# Patient Record
Sex: Male | Born: 1980 | Race: White | Hispanic: No | Marital: Single | Smoking: Former smoker
Health system: Southern US, Community
[De-identification: ages and names within clinical notes are randomized; demographics above are authoritative.]

## PROBLEM LIST (undated history)

## (undated) DIAGNOSIS — Q786 Multiple congenital exostoses: Secondary | ICD-10-CM

---

## 2017-10-19 ENCOUNTER — Ambulatory Visit (HOSPITAL_COMMUNITY)
Admission: EM | Admit: 2017-10-19 | Discharge: 2017-10-19 | Disposition: A | Discharge status: Home / Self Care | Payer: BLUE CROSS/BLUE SHIELD | Attending: Internal Medicine | Admitting: Internal Medicine

## 2017-10-19 ENCOUNTER — Other Ambulatory Visit: Payer: Self-pay

## 2017-10-19 ENCOUNTER — Encounter (HOSPITAL_COMMUNITY): Payer: Self-pay | Admitting: *Deleted

## 2017-10-19 DIAGNOSIS — R21 Rash and other nonspecific skin eruption: Secondary | ICD-10-CM

## 2017-10-19 HISTORY — DX: Multiple congenital exostoses: Q78.6

## 2017-10-19 MED ORDER — HYDROCORTISONE 1 % EX CREA: TOPICAL_CREAM | CUTANEOUS | 0 refills | Status: DC

## 2017-10-19 MED ORDER — PERMETHRIN 5 % EX CREA: TOPICAL_CREAM | CUTANEOUS | 0 refills | Status: DC

## 2017-10-19 MED ORDER — DIPHENHYDRAMINE HCL 25 MG PO CAPS
ORAL_CAPSULE | ORAL | Status: AC
  Filled 2017-10-19: qty 2

## 2017-10-19 MED ORDER — PREDNISONE 10 MG (21) PO TBPK: ORAL_TABLET | Freq: Every day | ORAL | 0 refills | Status: DC

## 2017-10-19 MED ORDER — DIPHENHYDRAMINE HCL 25 MG PO CAPS
50.0000 mg | ORAL_CAPSULE | Freq: Once | ORAL | Status: AC
  Administered 2017-10-19: 50 mg via ORAL

## 2017-10-19 NOTE — ED Triage Notes (Signed)
C/O rash to right anterior wrist x 3 wks.  OTC treatments not working.

## 2017-10-19 NOTE — ED Provider Notes (Signed)
[facility]    CSN: [id number] Arrival date & time: 10/19/17  1603     History   Chief Complaint Chief Complaint  Patient presents with  . Rash    HPI Carl Haynes is a 37 y.o. male.   37 year old male presents with rash to right hand and wrist x3 weeks.  Patient states he is not sure if you came in contact with anything however he has been applying Gold Bond with no relief.  Patient endorses pruritus.      Past Medical History:  Diagnosis Date  . Multiple exostoses syndrome     There are no active problems to display for this patient.   History reviewed. No pertinent surgical history.     Home Medications    Prior to Admission medications   Not on File    Family History History reviewed. No pertinent family history.  Social History Social History   Tobacco Use  . Smoking status: Never Smoker  . Smokeless tobacco: Never Used  Substance Use Topics  . Alcohol use: Yes    Comment: occasionally  . Drug use: Never     Allergies   Penicillins   Review of Systems Review of Systems  Constitutional: Negative for chills and fever.  HENT: Negative for ear pain and sore throat.   Eyes: Negative for pain and visual disturbance.  Respiratory: Negative for cough and shortness of breath.   Cardiovascular: Negative for chest pain and palpitations.  Gastrointestinal: Negative for abdominal pain and vomiting.  Genitourinary: Negative for dysuria and hematuria.  Musculoskeletal: Negative for arthralgias and back pain.  Skin: Positive for rash ( right hand). Negative for color change.  Neurological: Negative for seizures and syncope.  All other systems reviewed and are negative.    Physical Exam Triage Vital Signs ED Triage Vitals  Enc Vitals Group     BP 10/19/17 1755 136/78     Pulse Rate 10/19/17 1755 72     Resp 10/19/17 1755 14     Temp 10/19/17 1755 98.3 F (36.8 C)     Temp Source 10/19/17 1755 Oral     SpO2 10/19/17 1755 100 %     Weight --      Height --      Head Circumference --      Peak Flow --      Pain Score 10/19/17 1756 0     Pain Loc --      Pain Edu? --      Excl. in GC? --    No data found.  Updated Vital Signs BP 136/78   Pulse 72   Temp 98.3 F (36.8 C) (Oral)   Resp 14   SpO2 100%   Visual Acuity Right Eye Distance:   Left Eye Distance:   Bilateral Distance:    Right Eye Near:   Left Eye Near:    Bilateral Near:     Physical Exam  Constitutional: He is oriented to person, place, and time. He appears well-developed and well-nourished.  HENT:  Head: Normocephalic.  Neck: Normal range of motion.  Pulmonary/Chest: Effort normal.  Musculoskeletal: Normal range of motion.  Neurological: He is alert and oriented to person, place, and time.  Skin: Skin is dry. Rash ( right hand and anterior aspect of right wrist. ) noted.  Psychiatric: He has a normal mood and affect.  Nursing note and vitals reviewed.    UC Treatments / Results  Labs (all labs ordered are listed, but only  abnormal results are displayed) Labs Reviewed - No data to display  EKG None  Radiology No results found.  Procedures Procedures (including critical care time)  Medications Ordered in UC Medications  diphenhydrAMINE (BENADRYL) capsule 50 mg (has no administration in time range)    Initial Impression / Assessment and Plan / UC Course  I have reviewed the triage vital signs and the nursing notes.  Pertinent labs & imaging results that were available during my care of the patient were reviewed by me and considered in my medical decision making (see chart for details).      Final Clinical Impressions(s) / UC Diagnoses   Final diagnoses:  None   Discharge Instructions   None    ED Prescriptions    None     Controlled Substance Prescriptions [place] Controlled Substance Registry consulted? Not Applicable   Alene MiresOmohundro, Vanilla Heatherington C, NP 10/19/17 1819

## 2018-01-05 ENCOUNTER — Emergency Department (HOSPITAL_COMMUNITY)
Admission: EM | Admit: 2018-01-05 | Discharge: 2018-01-05 | Disposition: A | Discharge status: Home / Self Care | Payer: BLUE CROSS/BLUE SHIELD | Attending: Emergency Medicine | Admitting: Emergency Medicine

## 2018-01-05 ENCOUNTER — Encounter (HOSPITAL_COMMUNITY): Payer: Self-pay | Admitting: Emergency Medicine

## 2018-01-05 ENCOUNTER — Other Ambulatory Visit: Payer: Self-pay

## 2018-01-05 DIAGNOSIS — R21 Rash and other nonspecific skin eruption: Secondary | ICD-10-CM | POA: Insufficient documentation

## 2018-01-05 MED ORDER — TRIAMCINOLONE ACETONIDE 0.5 % EX OINT: 1.0000 "application " | TOPICAL_OINTMENT | Freq: Two times a day (BID) | CUTANEOUS | 0 refills | Status: AC

## 2018-01-05 NOTE — ED Triage Notes (Addendum)
Was seen  At [facility] for rash did not finish the steriods , states rash is worse did not understand instructions rash is on arms

## 2018-01-05 NOTE — Discharge Instructions (Addendum)
Please see the information and instructions below regarding your visit.  Your diagnoses today include:  1. Rash and nonspecific skin eruption    Your rash appears consistent with psoriasis.  This is an inflammatory and autoimmune lesion.  Tests performed today include: See side panel of your discharge paperwork for testing performed today. Vital signs are listed at the bottom of these instructions.   Medications prescribed:    Take any prescribed medications only as prescribed, and any over the counter medications only as directed on the packaging.  Please apply the Kenalog cream twice a day for 7 to 10 days.  Please do not apply this anywhere else on the body, as it is a strong topical medication.  Home care instructions:  Please follow any educational materials contained in this packet.   Follow-up instructions: Please follow-up with your primary care provider as soon as possible for further evaluation of your symptoms if they are not completely improved.   Please follow up with dermatology as soon as possible.   Return instructions:  Please return to the Emergency Department if you experience worsening symptoms. Return to the emergency department for any increasing redness of the arms, or fever or chills with your symptoms. Please return if you have any other emergent concerns.  Additional Information:   Your vital signs today were: BP 129/84 (BP Location: Right Arm)    Pulse 93    Temp 98.6 F (37 C) (Oral)    Resp 17    SpO2 100%  If your blood pressure (BP) was elevated on multiple readings during this visit above 130 for the top number or above 80 for the bottom number, please have this repeated by your primary care provider within one month. --------------  Thank you for allowing [place] to participate in your care today.

## 2018-01-05 NOTE — ED Provider Notes (Signed)
[facility] EMERGENCY DEPARTMENT Provider Note   CSN: [id number] Arrival date & time: 01/05/18  1223     History   Chief Complaint Chief Complaint  Patient presents with  . Rash    HPI Carl Haynes is a 37 y.o. male.  HPI  Patient is a 37 year old male with a history of multiple exostoses syndrome presenting for rash on his right upper extremity for approximately 4 to 5 months.  He reports that he was treated at urgent care in August 2019, however the rash has returned and is not improved.  He describes it is scaly and flaky, and pruritic.  Patient does report he has occasional joint pain.  Patient denies any fevers, chills, recent travel, chemical exposure, changes in topical treatments, lotions, soaps, detergents, or changes in medications. Denies history of IVDU.   Past Medical History:  Diagnosis Date  . Multiple exostoses syndrome     There are no active problems to display for this patient.   History reviewed. No pertinent surgical history.      Home Medications    Prior to Admission medications   Medication Sig Start Date End Date Taking? Authorizing Provider  hydrocortisone cream 1 % Apply to affected area 2 times daily 10/19/17   Alene Mires, NP  permethrin (ELIMITE) 5 % cream Apply to affected area once 10/19/17   Alene Mires, NP  predniSONE (STERAPRED UNI-PAK 21 TAB) 10 MG (21) TBPK tablet Take by mouth daily. Take 6 tabs by mouth daily  for 2 days, then 5 tabs for 2 days, then 4 tabs for 2 days, then 3 tabs for 2 days, 2 tabs for 2 days, then 1 tab by mouth daily for 2 days 10/19/17   Alene Mires, NP    Family History No family history on file.  Social History Social History   Tobacco Use  . Smoking status: Never Smoker  . Smokeless tobacco: Never Used  Substance Use Topics  . Alcohol use: Yes    Comment: occasionally  . Drug use: Never     Allergies   Penicillins   Review of Systems Review of  Systems  Constitutional: Negative for chills and fever.  Skin: Positive for color change and rash.  Allergic/Immunologic: Negative for immunocompromised state.     Physical Exam Updated Vital Signs BP 129/84 (BP Location: Right Arm)   Pulse 93   Temp 98.6 F (37 C) (Oral)   Resp 17   SpO2 100%   Physical Exam  Constitutional: He appears well-developed and well-nourished. No distress.  Sitting comfortably in exam chair.   HENT:  Head: Normocephalic and atraumatic.  Eyes: Conjunctivae are normal. Right eye exhibits no discharge. Left eye exhibits no discharge.  EOMs normal to gross examination.  Neck: Normal range of motion.  Cardiovascular: Normal rate and regular rhythm.  Intact, 2+ radial pulse of RUE.   Pulmonary/Chest:  Normal respiratory effort. Patient converses comfortably. No audible wheeze or stridor.  Abdominal: He exhibits no distension.  Musculoskeletal: Normal range of motion.  Neurological: He is alert.  Cranial nerves intact to gross observation. Patient moves extremities without difficulty.  Skin: Skin is warm and dry. He is not diaphoretic. There is erythema.  Please see clinical photo for details.  Patient has erythematous plaque with overlying scales of the inter-webspace between the first and second digits the right upper extreme and extending up to the wrist.  Patient has a linear erythematous plaque that is extending from the volar  surface of the wrist up to the antecubital region.  Psychiatric: He has a normal mood and affect. His behavior is normal. Judgment and thought content normal.  Nursing note and vitals reviewed.        ED Treatments / Results  Labs (all labs ordered are listed, but only abnormal results are displayed) Labs Reviewed - No data to display  EKG None  Radiology No results found.  Procedures Procedures (including critical care time)  Medications Ordered in ED Medications - No data to display   Initial Impression /  Assessment and Plan / ED Course  I have reviewed the triage vital signs and the nursing notes.  Pertinent labs & imaging results that were available during my care of the patient were reviewed by me and considered in my medical decision making (see chart for details).     Patient nontoxic-appearing, afebrile, and in no acute distress.  Patient with lesions of the right upper extremity that are consistent with psoriatic lesions.  Patient denies any history of IVDU.  No history of immune compromise status.  We will treat with Kenalog cream.  Patient instructed that he needs to follow-up with a dermatologist and primary care provider soon as possible.  I instructed patient on how to do this.  Return precautions given for any increasing erythema, fever or chills with rash or any new or worsening symptoms.  Patient is in understanding and agrees with the plan of care.  Final Clinical Impressions(s) / ED Diagnoses   Final diagnoses:  Rash and nonspecific skin eruption    ED Discharge Orders         Ordered    triamcinolone ointment (KENALOG) 0.5 %  2 times daily     01/05/18 1341           Elisha Ponder, [place] 01/05/18 1342    Tegeler, Canary Brim, MD 01/05/18 1956

## 2019-02-01 ENCOUNTER — Emergency Department (HOSPITAL_COMMUNITY)
Admission: EM | Admit: 2019-02-01 | Discharge: 2019-02-01 | Disposition: A | Discharge status: Home / Self Care | Payer: BC Managed Care – PPO | Attending: Emergency Medicine | Admitting: Emergency Medicine

## 2019-02-01 ENCOUNTER — Emergency Department (HOSPITAL_COMMUNITY): Payer: BC Managed Care – PPO

## 2019-02-01 ENCOUNTER — Encounter (HOSPITAL_COMMUNITY): Payer: Self-pay

## 2019-02-01 ENCOUNTER — Other Ambulatory Visit: Payer: Self-pay

## 2019-02-01 DIAGNOSIS — M545 Low back pain, unspecified: Secondary | ICD-10-CM

## 2019-02-01 DIAGNOSIS — Y999 Unspecified external cause status: Secondary | ICD-10-CM | POA: Diagnosis not present

## 2019-02-01 DIAGNOSIS — F419 Anxiety disorder, unspecified: Secondary | ICD-10-CM

## 2019-02-01 DIAGNOSIS — Y939 Activity, unspecified: Secondary | ICD-10-CM | POA: Insufficient documentation

## 2019-02-01 DIAGNOSIS — T148XXA Other injury of unspecified body region, initial encounter: Secondary | ICD-10-CM

## 2019-02-01 DIAGNOSIS — R52 Pain, unspecified: Secondary | ICD-10-CM

## 2019-02-01 DIAGNOSIS — L409 Psoriasis, unspecified: Secondary | ICD-10-CM

## 2019-02-01 DIAGNOSIS — Y929 Unspecified place or not applicable: Secondary | ICD-10-CM | POA: Insufficient documentation

## 2019-02-01 DIAGNOSIS — Z87891 Personal history of nicotine dependence: Secondary | ICD-10-CM | POA: Diagnosis not present

## 2019-02-01 HISTORY — DX: Psoriasis, unspecified: L40.9

## 2019-02-01 HISTORY — DX: Anxiety disorder, unspecified: F41.9

## 2019-02-01 MED ORDER — LIDOCAINE 5 % EX PTCH
1.0000 | MEDICATED_PATCH | Freq: Once | CUTANEOUS | Status: DC
  Administered 2019-02-01: 10:00:00 1 via TRANSDERMAL
  Filled 2019-02-01: qty 1

## 2019-02-01 MED ORDER — CYCLOBENZAPRINE HCL 10 MG PO TABS: 10.0000 mg | ORAL_TABLET | Freq: Two times a day (BID) | ORAL | 0 refills | Status: DC | PRN

## 2019-02-01 MED ORDER — CYCLOBENZAPRINE HCL 10 MG PO TABS
10.0000 mg | ORAL_TABLET | Freq: Once | ORAL | Status: AC
  Administered 2019-02-01: 10 mg via ORAL
  Filled 2019-02-01: qty 1

## 2019-02-01 NOTE — Discharge Instructions (Addendum)
Your back pain should be treated with medicines such as ibuprofen or tylenol and this back pain should get better over the next 2 weeks.  ° °Follow Up: Please follow up with your primary healthcare provider in 1-2 weeks for reassessment. if you do not have a primary care doctor use the resource guide provided to find one. ° °Low back pain is discomfort in the lower back that may be due to injuries to muscles and ligaments around the spine. Occasionally, it may be caused by a a problem to a part of the spine called a disc. The pain may last several days or a week;  However, most patients get completely well in 4 weeks. ° ° °1. Medications: Alternate 600 mg of ibuprofen and 500-1000 mg of Tylenol every 3 hours as needed for pain. Do not exceed 4000 mg of Tylenol daily.  Take ibuprofen with food to avoid upset stomach issues.   °Muscle relaxants:  These medications can help with muscle tightness that is a cause of lower back pain. Most of these medications can cause drowsiness, and it is not safe to drive or use dangerous machinery while taking them.You can take Flexeril as needed for muscle spasm up to twice daily but do not drive, drink alcohol, or operate heavy machinery while taking this medicine because it may make you drowsy.  I typically recommend taking this medicine only at night when you are going to sleep.  You can also cut these tablets in half if they make you feel very drowsy. ° °2. Treatment: rest, drink plenty of fluids, gentle stretching as discussed (see attached), alternate ice and heat (or stick with whichever feels best) 20 minutes on 20 minutes off. Maintaining your daily activities, including walking, is encourged, as it will help you get better faster than just staying in bed. ° ° ° °Be aware that if you develop new symptoms, such as a fever, leg weakness, difficulty with or loss of control of your urine or bowels, abdominal pain, or more severe pain, you will need to seek medical attention  immediately and  / or return to the Emergency department. ° ° ° ° °
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
## 2019-06-11 ENCOUNTER — Ambulatory Visit: Payer: BC Managed Care – PPO | Attending: Internal Medicine

## 2019-06-11 DIAGNOSIS — Z23 Encounter for immunization: Secondary | ICD-10-CM

## 2019-06-11 NOTE — Progress Notes (Signed)
   Covid-19 Vaccination Clinic  Name:  Carl Haynes    MRN: [id number] DOB: 19-Feb-1981  06/11/2019  [name] was observed post Covid-19 immunization for 15 minutes without incident. He was provided with Vaccine Information Sheet and instruction to access the V-Safe system.   [name] was instructed to call 911 with any severe reactions post vaccine: Marland Kitchen Difficulty breathing  . Swelling of face and throat  . A fast heartbeat  . A bad rash all over body  . Dizziness and weakness   Immunizations Administered    Name Date Dose VIS Date Route   Pfizer COVID-19 Vaccine 06/11/2019  9:48 AM 0.3 mL 02/06/2019 Intramuscular   Manufacturer: [employer], [employer]   Lot: [id number]   NDC: [address]-3

## 2019-07-06 ENCOUNTER — Ambulatory Visit: Payer: BC Managed Care – PPO | Attending: Internal Medicine

## 2019-07-06 DIAGNOSIS — Z23 Encounter for immunization: Secondary | ICD-10-CM

## 2019-07-06 NOTE — Progress Notes (Signed)
   Covid-19 Vaccination Clinic  Name:  Masen Luallen    MRN: [id number] DOB: 11-Dec-1980  07/06/2019  Mr. Colberg was observed post Covid-19 immunization for 15 minutes without incident. He was provided with Vaccine Information Sheet and instruction to access the V-Safe system.   Mr. Vanzanten was instructed to call 911 with any severe reactions post vaccine: Marland Kitchen Difficulty breathing  . Swelling of face and throat  . A fast heartbeat  . A bad rash all over body  . Dizziness and weakness   Immunizations Administered    Name Date Dose VIS Date Route   Pfizer COVID-19 Vaccine 07/06/2019 11:57 AM 0.3 mL 04/22/2018 Intramuscular   Manufacturer: [employer], [employer]   Lot: [id number]   NDC: [address]-7
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
## 2022-04-29 ENCOUNTER — Encounter (HOSPITAL_COMMUNITY): Payer: Self-pay

## 2022-04-29 ENCOUNTER — Emergency Department (HOSPITAL_COMMUNITY)
Admission: EM | Admit: 2022-04-29 | Discharge: 2022-04-29 | Disposition: A | Discharge status: Home / Self Care | Payer: No Typology Code available for payment source | Attending: Emergency Medicine | Admitting: Emergency Medicine

## 2022-04-29 ENCOUNTER — Ambulatory Visit (HOSPITAL_COMMUNITY)
Admission: EM | Admit: 2022-04-29 | Discharge: 2022-04-29 | Disposition: A | Discharge status: Home / Self Care | Payer: No Typology Code available for payment source

## 2022-04-29 ENCOUNTER — Emergency Department (HOSPITAL_COMMUNITY): Payer: No Typology Code available for payment source

## 2022-04-29 ENCOUNTER — Other Ambulatory Visit: Payer: Self-pay

## 2022-04-29 DIAGNOSIS — M25512 Pain in left shoulder: Secondary | ICD-10-CM | POA: Insufficient documentation

## 2022-04-29 NOTE — ED Notes (Signed)
Sling placed back on left shoulder without difficulty.

## 2022-04-29 NOTE — ED Provider Notes (Signed)
Carl Haynes Note   CSN: [id number] Arrival date & time: 04/29/22  1803     History  Chief Complaint  Patient presents with   Shoulder Injury    Carl Haynes is a 42 y.o. male with history of recurring left shoulder dislocation presented ED with complaint of shoulder dislocation.  Patient ports he was at work he was reaching overhead and felt the shoulder dislocate.  He says this happens multiple times in a year and he typically is able to relocate it but was not able to do so today.  He went to an urgent care was referred into the ED for further evaluation.  HPI     Home Medications Prior to Admission medications   Medication Sig Start Date End Date Taking? Authorizing Haynes  citalopram (CELEXA) 20 MG tablet Take 20 mg by mouth daily.    Haynes, Historical, MD  cyclobenzaprine (FLEXERIL) 10 MG tablet Take 1 tablet (10 mg total) by mouth 2 (two) times daily as needed for muscle spasms. 02/01/19   Barrie Folk, PA-C  hydrocortisone cream 1 % Apply to affected area 2 times daily Patient not taking: Reported on 02/01/2019 10/19/17   Jacqualine Mau, NP  permethrin (ELIMITE) 5 % cream Apply to affected area once Patient not taking: Reported on 02/01/2019 10/19/17   Jacqualine Mau, NP  QUEtiapine (SEROQUEL) 200 MG tablet Take 200 mg by mouth at bedtime. 04/11/22   Haynes, Historical, MD      Allergies    Penicillins    Review of Systems   Review of Systems  Physical Exam Updated Vital Signs BP (!) 130/95   Pulse 92   Temp 98.5 F (36.9 C)   Resp 17   Ht '5\' 5"'$  (1.651 m)   Wt 65.8 kg   SpO2 98%   BMI 24.13 kg/m  Physical Exam Constitutional:      General: He is not in acute distress. HENT:     Head: Normocephalic and atraumatic.  Eyes:     Conjunctiva/sclera: Conjunctivae normal.     Pupils: Pupils are equal, round, and reactive to light.  Cardiovascular:     Rate and Rhythm: Normal rate  and regular rhythm.     Pulses: Normal pulses.  Pulmonary:     Effort: Pulmonary effort is normal. No respiratory distress.  Musculoskeletal:     Comments: Squaring of the left shoulder, no crepitus or bony tenderness,  Skin:    General: Skin is warm and dry.  Neurological:     General: No focal deficit present.     Mental Status: He is alert and oriented to person, place, and time. Mental status is at baseline.     Sensory: No sensory deficit.  Psychiatric:        Mood and Affect: Mood normal.        Behavior: Behavior normal.     ED Results / Procedures / Treatments   Labs (all labs ordered are listed, but only abnormal results are displayed) Labs Reviewed - No data to display  EKG None  Radiology DG Shoulder Left  Result Date: 04/29/2022 CLINICAL DATA:  Heavy lifting with left shoulder pain and possible dislocation, initial encounter EXAM: LEFT SHOULDER - 2+ VIEW COMPARISON:  None Available. FINDINGS: Deformity of the proximal humerus is noted likely related to prior fracture and healing. The humeral head is well seated. No acute fracture is noted. The underlying bony thorax appears within normal limits. IMPRESSION:  Deformity of the proximal humeral shaft likely related to prior trauma and healing. This is stable from 2020. No acute fracture or dislocation is noted. Electronically Signed   By: Inez Catalina M.D.   On: 04/29/2022 19:00    Procedures Procedures    Medications Ordered in ED Medications - No data to display  ED Course/ Medical Decision Making/ A&P                             Medical Decision Making Amount and/or Complexity of Data Reviewed Radiology: ordered.   Patient is here with left shoulder dislocation.  I personally reviewed and interpreted the patient's x-rays which was notable for no evident dislocation  The patient was able to perform full range of motion at the left shoulder and was able to pull his shirt off of his head with overhead arm  raise.  However seem to have some imitation when I asked him to actively raise his left arm up to 90 degrees.  I question whether he may have a rotator cuff injury instead.  We will place him in an arm sling to help immobilize the shoulder and I strongly encouraged him to follow-up with an orthopedic doctor for these recurring dislocations.  He verbalized understanding.  The patient came with a sling and can leave at the same sling         Final Clinical Impression(s) / ED Diagnoses Final diagnoses:  Acute pain of left shoulder    Rx / DC Orders ED Discharge Orders     None         Akaya Proffit, Carola Rhine, MD 04/29/22 1943

## 2022-04-29 NOTE — ED Triage Notes (Addendum)
Pt reports he was at work and had to lift something up high and when his left shoulder became dislocated. States this has happened multiple times in the past. States he is usually able to get it to go back in place, but did not try to today. Went to UC and they sent him to the ED.

## 2022-04-29 NOTE — Discharge Instructions (Addendum)
Please go to the ER immediately for further evaluation and management of your left shoulder dislocation as we do not have the tools to be able to reduce this in the urgent care setting.  I have provided you with a sling.

## 2022-04-29 NOTE — ED Triage Notes (Signed)
Pt reports he was lifting something that was high up and pulled his left shoulder out of socket today. He has reoccurring shoulder socket problems.

## 2022-04-29 NOTE — ED Provider Notes (Signed)
[facility]    CSN: [id number] Arrival date & time: 04/29/22  1624      History   Chief Complaint Chief Complaint  Patient presents with   Shoulder Injury    HPI Carl Haynes is a 42 y.o. male.   Patient presents to urgent care for evaluation of shoulder dislocation after he was attempting to reach for something on a high shelf today     Shoulder Injury    Past Medical History:  Diagnosis Date   Anxiety 02/01/2019   Multiple exostoses syndrome    Psoriasis 02/01/2019    There are no problems to display for this patient.   History reviewed. No pertinent surgical history.     Home Medications    Prior to Admission medications   Medication Sig Start Date End Date Taking? Authorizing Provider  QUEtiapine (SEROQUEL) 200 MG tablet Take 200 mg by mouth at bedtime. 04/11/22  Yes [provider]  citalopram (CELEXA) 20 MG tablet Take 20 mg by mouth daily.    [provider]  cyclobenzaprine (FLEXERIL) 10 MG tablet Take 1 tablet (10 mg total) by mouth 2 (two) times daily as needed for muscle spasms. 02/01/19   Barrie Folk, PA-C  hydrocortisone cream 1 % Apply to affected area 2 times daily Patient not taking: Reported on 02/01/2019 10/19/17   Jacqualine Mau, NP  permethrin (ELIMITE) 5 % cream Apply to affected area once Patient not taking: Reported on 02/01/2019 10/19/17   Jacqualine Mau, NP    Family History History reviewed. No pertinent family history.  Social History Social History   Tobacco Use   Smoking status: Former    Types: Cigarettes   Smokeless tobacco: Never  Vaping Use   Vaping Use: Never used  Substance Use Topics   Alcohol use: Yes    Comment: occasionally   Drug use: Not Currently    Comment: Used to smoke marijuana but stopped once prescribed anxiety medicaiton     Allergies   Penicillins   Review of Systems Review of Systems   Physical Exam Triage Vital Signs ED Triage Vitals   Enc Vitals Group     BP 04/29/22 1708 125/72     Pulse Rate 04/29/22 1708 95     Resp 04/29/22 1708 20     Temp 04/29/22 1708 98.6 F (37 C)     Temp Source 04/29/22 1708 Oral     SpO2 04/29/22 1708 96 %     Weight --      Height --      Head Circumference --      Peak Flow --      Pain Score 04/29/22 1710 6     Pain Loc --      Pain Edu? --      Excl. in [place]? --    No data found.  Updated Vital Signs BP 125/72 (BP Location: Left Arm)   Pulse 95   Temp 98.6 F (37 C) (Oral)   Resp 20   SpO2 96%   Visual Acuity Right Eye Distance:   Left Eye Distance:   Bilateral Distance:    Right Eye Near:   Left Eye Near:    Bilateral Near:     Physical Exam   UC Treatments / Results  Labs (all labs ordered are listed, but only abnormal results are displayed) Labs Reviewed - No data to display  EKG   Radiology No results found.  Procedures Procedures (including critical care  time)  Medications Ordered in UC Medications - No data to display  Initial Impression / Assessment and Plan / UC Course  I have reviewed the triage vital signs and the nursing notes.  Pertinent labs & imaging results that were available during my care of the patient were reviewed by me and considered in my medical decision making (see chart for details).     *** Final Clinical Impressions(s) / UC Diagnoses   Final diagnoses:  None   Discharge Instructions   None    ED Prescriptions   None    PDMP not reviewed this encounter.

## 2022-08-22 ENCOUNTER — Emergency Department (HOSPITAL_COMMUNITY)
Admission: EM | Admit: 2022-08-22 | Discharge: 2022-08-22 | Discharge status: Against Medical Advice | Payer: No Typology Code available for payment source | Attending: Emergency Medicine | Admitting: Emergency Medicine

## 2022-08-22 ENCOUNTER — Other Ambulatory Visit: Payer: Self-pay

## 2022-08-22 DIAGNOSIS — Z5321 Procedure and treatment not carried out due to patient leaving prior to being seen by health care provider: Secondary | ICD-10-CM | POA: Diagnosis not present

## 2022-08-22 DIAGNOSIS — R220 Localized swelling, mass and lump, head: Secondary | ICD-10-CM | POA: Diagnosis present

## 2022-08-22 DIAGNOSIS — K0381 Cracked tooth: Secondary | ICD-10-CM | POA: Insufficient documentation

## 2022-08-22 NOTE — ED Notes (Signed)
Patient stated he was leaving. He had to go to work.

## 2022-08-22 NOTE — ED Triage Notes (Signed)
Pt reports 2 days of facial swelling on the right.  Has a broken tooth on that side.

## 2022-10-20 ENCOUNTER — Other Ambulatory Visit: Payer: Self-pay

## 2022-10-20 ENCOUNTER — Emergency Department (HOSPITAL_COMMUNITY)
Admission: EM | Admit: 2022-10-20 | Discharge: 2022-10-20 | Disposition: A | Discharge status: Home / Self Care | Payer: No Typology Code available for payment source | Attending: Emergency Medicine | Admitting: Emergency Medicine

## 2022-10-20 DIAGNOSIS — R21 Rash and other nonspecific skin eruption: Secondary | ICD-10-CM

## 2022-10-20 MED ORDER — TRIAMCINOLONE ACETONIDE 0.1 % EX CREA: 1.0000 | TOPICAL_CREAM | Freq: Two times a day (BID) | CUTANEOUS | 0 refills | Status: AC

## 2022-10-20 NOTE — ED Provider Notes (Signed)
[facility] AT [facility] Provider Note   CSN: [id number] Arrival date & time: 10/20/22  4098     History  Chief Complaint  Patient presents with   Rash    Carl Haynes is a 42 y.o. male with medical history of multiple exostoses syndrome, psoriasis, anxiety.  Patient presents to the ED for evaluation of rash.  Patient states that beginning yesterday he developed a nonblistering, burning and itchy rash to his bilateral shoulders, bilateral anterior thighs.  He states he has a history of the same.  At 1 point he was diagnosed with psoriasis however has never followed up with dermatologist or a PCP as he states that he "does not trust doctors".  He reports that he does not trust doctors as a result of multiple x-rays that were advised that he take as a child due to his multiple exostoses syndrome.  Patient states he is never followed up with dermatology or PCP.  He is also endorsing joint pain that he states has been ongoing for years now.  Denies any one-sided wounds or numbness, nausea vomiting, abdominal pain, chest pain, shortness of breath.  Denies fevers.   Rash      Home Medications Prior to Admission medications   Medication Sig Start Date End Date Taking? Authorizing Provider  triamcinolone cream (KENALOG) 0.1 % Apply 1 Application topically 2 (two) times daily. 10/20/22  Yes Al Decant, PA-C  citalopram (CELEXA) 20 MG tablet Take 20 mg by mouth daily.    [provider]  cyclobenzaprine (FLEXERIL) 10 MG tablet Take 1 tablet (10 mg total) by mouth 2 (two) times daily as needed for muscle spasms. 02/01/19   Shanon Ace, PA-C  hydrocortisone cream 1 % Apply to affected area 2 times daily Patient not taking: Reported on 02/01/2019 10/19/17   Alene Mires, NP  permethrin (ELIMITE) 5 % cream Apply to affected area once Patient not taking: Reported on 02/01/2019 10/19/17   Alene Mires, NP  QUEtiapine  (SEROQUEL) 200 MG tablet Take 200 mg by mouth at bedtime. 04/11/22   [provider]      Allergies    Penicillins    Review of Systems   Review of Systems  Skin:  Positive for rash.  All other systems reviewed and are negative.   Physical Exam Updated Vital Signs There were no vitals taken for this visit. Physical Exam Vitals and nursing note reviewed.  Constitutional:      General: He is not in acute distress.    Appearance: Normal appearance. He is not ill-appearing, toxic-appearing or diaphoretic.  HENT:     Head: Normocephalic and atraumatic.     Nose: Nose normal.     Mouth/Throat:     Mouth: Mucous membranes are moist.     Pharynx: Oropharynx is clear.  Eyes:     Extraocular Movements: Extraocular movements intact.     Conjunctiva/sclera: Conjunctivae normal.     Pupils: Pupils are equal, round, and reactive to light.  Cardiovascular:     Rate and Rhythm: Normal rate and regular rhythm.  Pulmonary:     Effort: Pulmonary effort is normal.     Breath sounds: Normal breath sounds. No wheezing.  Abdominal:     General: Abdomen is flat. Bowel sounds are normal.     Palpations: Abdomen is soft.     Tenderness: There is no abdominal tenderness.  Musculoskeletal:     Cervical back: Normal range of motion and neck  supple. No tenderness.  Skin:    General: Skin is warm and dry.     Capillary Refill: Capillary refill takes less than 2 seconds.     Comments: Rash to bilateral shoulders, bilateral anterior thighs.  Rash blanches, does not slough, negative Nikolsky sign.  Neurological:     Mental Status: He is alert and oriented to person, place, and time.     ED Results / Procedures / Treatments   Labs (all labs ordered are listed, but only abnormal results are displayed) Labs Reviewed - No data to display  EKG None  Radiology No results found.  Procedures Procedures   Medications Ordered in ED Medications - No data to display  ED Course/ Medical  Decision Making/ A&P  Medical Decision Making  42 year old male presents to the ED for evaluation.  Please see HPI for further details.  On examination patient does have diffuse rash to bilateral shoulders, bilateral anterior thighs.  Rash is not soft, negative Nikolsky sign.  Patient is afebrile and nontachycardic.  His lung sounds are clear bilaterally and he is not hypoxic.  His abdomen is soft and compressible throughout.  Neurological examination at baseline.  Full range of motion of all extremities.  Rash is blanching, negative Nikolsky sign.  The patient has no oral mucosal involvement.  Patient reports he has not followed up with dermatology.  Patient states that he has been advised he has psoriasis in the past, this is confirmed on chart review.  Patient was seen in 2019 and advised he had psoriasis.  Patient states he has not followed up yet.  Patient encouraged to follow-up with PCP, he was referred to 1 today.  He was sent home with Kenalog cream.  The patient will follow-up with his PCP.  He was given return precautions and he voiced understanding.  All of his questions answered to his satisfaction.  He is stable to discharge home.   Final Clinical Impression(s) / ED Diagnoses Final diagnoses:  Rash    Rx / DC Orders ED Discharge Orders          Ordered    triamcinolone cream (KENALOG) 0.1 %  2 times daily        10/20/22 0817              Al Decant, PA-C 10/20/22 0827    Rexford Maus, DO 10/20/22 [id number]

## 2022-10-20 NOTE — ED Triage Notes (Signed)
Patient reports itchy skin rashes at arms and legs this morning .

## 2022-10-20 NOTE — Discharge Instructions (Addendum)
It was a pleasure taking part in your care today.  As we discussed, I believe you have psoriasis.  For this I am prescribing you a cream that you will apply to the areas that have irritation.  Please follow the directions and apply as prescribed.  I would like you to follow-up with a PCP concerning this rash.  Please follow-up with [facility], please utilize the attached information to call and make an appointment on Monday.  Please return to the ED with any new or worsening signs or symptoms.

## 2022-10-20 NOTE — ED Notes (Signed)
Gave patient ear plugs due to loud patient adjacent to his hallway bed.

## 2022-10-20 NOTE — ED Notes (Signed)
Pt is a&ox4, pwd. Pt complains of rash to lower legs that extend all the way up to hips. Looks like red streaks, scratches. Also present on right upper arm, which looks a little more like hives. Pt states not currently itching. Pt has ear plugs in due to increased stimulation at the next bed. Pt says he is out of his anxiety meds and the earplugs help reduce stimulation and increase in anxiety.

## 2022-11-04 ENCOUNTER — Encounter (HOSPITAL_COMMUNITY): Payer: Self-pay | Admitting: Emergency Medicine

## 2022-11-04 ENCOUNTER — Ambulatory Visit (HOSPITAL_COMMUNITY)
Admission: EM | Admit: 2022-11-04 | Discharge: 2022-11-04 | Disposition: A | Discharge status: Home / Self Care | Payer: No Typology Code available for payment source | Attending: Emergency Medicine | Admitting: Emergency Medicine

## 2022-11-04 ENCOUNTER — Ambulatory Visit (INDEPENDENT_AMBULATORY_CARE_PROVIDER_SITE_OTHER): Payer: No Typology Code available for payment source

## 2022-11-04 ENCOUNTER — Other Ambulatory Visit: Payer: Self-pay

## 2022-11-04 DIAGNOSIS — M542 Cervicalgia: Secondary | ICD-10-CM | POA: Diagnosis not present

## 2022-11-04 DIAGNOSIS — G8929 Other chronic pain: Secondary | ICD-10-CM

## 2022-11-04 NOTE — Discharge Instructions (Addendum)
You will need to contact Medical Records to obtain access to your xray images, to then pass along to your chiropractor.  You can call [phone] to request your images OR  You may also email your request to [email or web address]. Please include your name, date of birth, what date you were treated and the facility where you were treated.  Their address is: [facility], Health Information Management Department [address], [place] [address]   You have a new primary care appointment on Wednesday, September 11th at 1:30 PM. Details are below, and also attached on the last page

## 2022-11-04 NOTE — ED Provider Notes (Signed)
[facility]    CSN: [id number] Arrival date & time: 11/04/22  1314      History   Chief Complaint Chief Complaint  Patient presents with   Neck Pain   Headache    HPI Carl Haynes is a 42 y.o. male.  Here today requesting x-ray of his neck His chiropractor will not perform an adjustment without first seeing his x-ray. He reports a 30-year history of neck pain Denies any new or recent trauma.  No acute changes.  Past Medical History:  Diagnosis Date   Anxiety 02/01/2019   Multiple exostoses syndrome    Psoriasis 02/01/2019    There are no problems to display for this patient.   History reviewed. No pertinent surgical history.     Home Medications    Prior to Admission medications   Medication Sig Start Date End Date Taking? Authorizing Provider  citalopram (CELEXA) 20 MG tablet Take 20 mg by mouth daily.    [provider]  cyclobenzaprine (FLEXERIL) 10 MG tablet Take 1 tablet (10 mg total) by mouth 2 (two) times daily as needed for muscle spasms. 02/01/19   Shanon Ace, PA-C  hydrocortisone cream 1 % Apply to affected area 2 times daily Patient not taking: Reported on 02/01/2019 10/19/17   Alene Mires, NP  permethrin (ELIMITE) 5 % cream Apply to affected area once Patient not taking: Reported on 02/01/2019 10/19/17   Alene Mires, NP  QUEtiapine (SEROQUEL) 200 MG tablet Take 200 mg by mouth at bedtime. 04/11/22   [provider]  triamcinolone cream (KENALOG) 0.1 % Apply 1 Application topically 2 (two) times daily. 10/20/22   Al Decant, PA-C    Family History History reviewed. No pertinent family history.  Social History Social History   Tobacco Use   Smoking status: Former    Types: Cigarettes   Smokeless tobacco: Never  Vaping Use   Vaping status: Never Used  Substance Use Topics   Alcohol use: Yes    Comment: occasionally   Drug use: Not Currently    Comment: Used to smoke marijuana  but stopped once prescribed anxiety medicaiton     Allergies   Penicillins   Review of Systems Review of Systems   Physical Exam Triage Vital Signs ED Triage Vitals  Encounter Vitals Group     BP 11/04/22 1408 127/87     Systolic BP Percentile --      Diastolic BP Percentile --      Pulse Rate 11/04/22 1408 85     Resp 11/04/22 1408 18     Temp 11/04/22 1408 98.1 F (36.7 C)     Temp Source 11/04/22 1408 Oral     SpO2 11/04/22 1408 98 %     Weight 11/04/22 1409 145 lb 8.1 oz (66 kg)     Height 11/04/22 1409 5\' 5"  (1.651 m)     Head Circumference --      Peak Flow --      Pain Score 11/04/22 1409 7     Pain Loc --      Pain Education --      Exclude from Growth Chart --    No data found.  Updated Vital Signs BP 127/87 (BP Location: Right Arm)   Pulse 85   Temp 98.1 F (36.7 C) (Oral)   Resp 18   Ht 5\' 5"  (1.651 m)   Wt 145 lb 8.1 oz (66 kg)   SpO2 98%   BMI 24.21  kg/m   Physical Exam Vitals and nursing note reviewed.  Constitutional:      General: He is not in acute distress. HENT:     Head: Atraumatic.  Neck:     Comments: No obvious deformity or palpable step off. No spinal tenderness Cardiovascular:     Rate and Rhythm: Normal rate and regular rhythm.     Pulses: Normal pulses.  Pulmonary:     Effort: Pulmonary effort is normal.  Musculoskeletal:     Cervical back: Normal range of motion. No rigidity or tenderness.  Skin:    General: Skin is warm and dry.  Neurological:     Mental Status: He is alert and oriented to person, place, and time.     UC Treatments / Results  Labs (all labs ordered are listed, but only abnormal results are displayed) Labs Reviewed - No data to display  EKG  Radiology DG Cervical Spine Complete  Result Date: 11/04/2022 CLINICAL DATA:  Neck pain and headache. EXAM: CERVICAL SPINE - COMPLETE 4+ VIEW COMPARISON:  None Available. FINDINGS: There is no acute fracture or subluxation. Mild multilevel degenerative  changes with disc space narrowing and spurring most prominent at C4-C5 and C5-C6. The visualized posterior elements and odontoid appear intact. Mild right C3-C4 and C4-C5 neural foraminal narrowing. The soft tissues are unremarkable. IMPRESSION: 1. No acute findings. 2. Mild multilevel degenerative changes. Electronically Signed   By: Elgie Collard M.D.   On: 11/04/2022 15:28    Procedures Procedures   Medications Ordered in UC Medications - No data to display  Initial Impression / Assessment and Plan / UC Course  I have reviewed the triage vital signs and the nursing notes.  Pertinent labs & imaging results that were available during my care of the patient were reviewed by me and considered in my medical decision making (see chart for details).  Spoke with supervisor Dr. Leonides Grills who advised we can complete the xray for patient today, but he will need to contact medical records to obtain images for his chiropractor Cervical spine xray completed.  Provided with contact information for medical records I have also set him up with a primary care provider, visit is in 3 days. Recommend he establish and discuss his chronic symptoms. Has not seen PCP since he was 15.  Final Clinical Impressions(s) / UC Diagnoses   Final diagnoses:  Chronic neck pain     Discharge Instructions      You will need to contact Medical Records to obtain access to your xray images, to then pass along to your chiropractor.  You can call [phone] to request your images OR  You may also email your request to [email or web address]. Please include your name, date of birth, what date you were treated and the facility where you were treated.  Their address is: [facility], Health Information Management Department [address], [place] [address]   You have a new primary care appointment on Wednesday, September 11th at 1:30 PM. Details are below, and also attached on the last page    ED  Prescriptions   None    PDMP not reviewed this encounter.   Marlow Baars, [place] 11/04/22 9147

## 2022-11-04 NOTE — ED Triage Notes (Signed)
Pt endorse neck pain and HA for 30 years, requesting x-ray of his neck.

## 2022-11-07 ENCOUNTER — Ambulatory Visit (HOSPITAL_BASED_OUTPATIENT_CLINIC_OR_DEPARTMENT_OTHER): Payer: No Typology Code available for payment source | Admitting: Family Medicine

## 2023-01-17 ENCOUNTER — Encounter (HOSPITAL_BASED_OUTPATIENT_CLINIC_OR_DEPARTMENT_OTHER): Payer: Self-pay | Admitting: Family Medicine

## 2023-01-20 ENCOUNTER — Emergency Department (HOSPITAL_COMMUNITY)
Admission: EM | Admit: 2023-01-20 | Discharge: 2023-01-20 | Disposition: A | Discharge status: Home / Self Care | Payer: No Typology Code available for payment source | Attending: Emergency Medicine | Admitting: Emergency Medicine

## 2023-01-20 ENCOUNTER — Other Ambulatory Visit: Payer: Self-pay

## 2023-01-20 ENCOUNTER — Encounter (HOSPITAL_COMMUNITY): Payer: Self-pay | Admitting: *Deleted

## 2023-01-20 DIAGNOSIS — K029 Dental caries, unspecified: Secondary | ICD-10-CM | POA: Insufficient documentation

## 2023-01-20 DIAGNOSIS — K0889 Other specified disorders of teeth and supporting structures: Secondary | ICD-10-CM | POA: Diagnosis present

## 2023-01-20 MED ORDER — CLINDAMYCIN HCL 300 MG PO CAPS: 300.0000 mg | ORAL_CAPSULE | Freq: Three times a day (TID) | ORAL | 0 refills | Status: AC

## 2023-01-20 MED ORDER — IBUPROFEN 400 MG PO TABS
400.0000 mg | ORAL_TABLET | Freq: Once | ORAL | Status: AC
  Administered 2023-01-20: 400 mg via ORAL
  Filled 2023-01-20: qty 1

## 2023-01-20 MED ORDER — CLINDAMYCIN HCL 150 MG PO CAPS
300.0000 mg | ORAL_CAPSULE | Freq: Once | ORAL | Status: AC
  Administered 2023-01-20: 300 mg via ORAL
  Filled 2023-01-20: qty 2

## 2023-01-20 NOTE — ED Provider Notes (Signed)
  [place] EMERGENCY DEPARTMENT AT [facility] Provider Note   CSN: [id number] Arrival date & time: 01/20/23  6644     History  Chief Complaint  Patient presents with   Dental Pain    Carl Haynes is a 42 y.o. male.  The history is provided by the patient.  Dental Pain  Patient reports his left lower molar has been broken for several weeks.  It is now swollen and painful.  No fevers or vomiting.  No other acute complaints Reports he has dental follow-up next month    Home Medications Prior to Admission medications   Medication Sig Start Date End Date Taking? Authorizing Provider  clindamycin (CLEOCIN) 300 MG capsule Take 1 capsule (300 mg total) by mouth 3 (three) times daily. X 7 days 01/20/23  Yes Zadie Rhine, MD  citalopram (CELEXA) 20 MG tablet Take 20 mg by mouth daily.    [provider]  QUEtiapine (SEROQUEL) 200 MG tablet Take 200 mg by mouth at bedtime. 04/11/22   [provider]  triamcinolone cream (KENALOG) 0.1 % Apply 1 Application topically 2 (two) times daily. 10/20/22   Al Decant, PA-C      Allergies    Penicillins    Review of Systems   Review of Systems  Physical Exam Updated Vital Signs BP 129/86 (BP Location: Right Arm)   Pulse (!) 108   Temp 97.6 F (36.4 C) (Oral)   Resp 16   Ht 1.651 m (5\' 5" )   Wt 66 kg   SpO2 100%   BMI 24.21 kg/m  Physical Exam CONSTITUTIONAL: Well developed/well nourished HEAD AND FACE: Normocephalic/atraumatic EYES: EOMI/PERRL ENMT: Mucous membranes moist.  Poor dentition.  No trismus.  No focal abscess noted. Left lower molar is decayed and tender No stridor or drooling.  No dysphonia NECK: supple no meningeal signs NEURO: Pt is awake/alert, moves all extremitiesx4 Patient using his phone without difficulty   ED Results / Procedures / Treatments   Labs (all labs ordered are listed, but only abnormal results are displayed) Labs Reviewed - No data to  display  EKG None  Radiology No results found.  Procedures Procedures    Medications Ordered in ED Medications  clindamycin (CLEOCIN) capsule 300 mg (has no administration in time range)  ibuprofen (ADVIL) tablet 400 mg (has no administration in time range)    ED Course/ Medical Decision Making/ A&P                                 Medical Decision Making Risk Prescription drug management.   Start antibiotics.  Due to penicillin allergy will start clindamycin. Advise follow-up with dentistry        Final Clinical Impression(s) / ED Diagnoses Final diagnoses:  Dental caries    Rx / DC Orders ED Discharge Orders          Ordered    clindamycin (CLEOCIN) 300 MG capsule  3 times daily        01/20/23 0255              Zadie Rhine, MD 01/20/23 [id number]

## 2023-01-20 NOTE — ED Triage Notes (Signed)
The pt has a broken tooth for 2-3 weeks painful

## 2023-10-31 ENCOUNTER — Emergency Department (HOSPITAL_COMMUNITY)

## 2023-10-31 ENCOUNTER — Emergency Department (HOSPITAL_COMMUNITY)
Admission: EM | Admit: 2023-10-31 | Discharge: 2023-10-31 | Discharge status: Against Medical Advice | Attending: Emergency Medicine | Admitting: Emergency Medicine

## 2023-10-31 ENCOUNTER — Encounter (HOSPITAL_COMMUNITY): Payer: Self-pay | Admitting: Emergency Medicine

## 2023-10-31 ENCOUNTER — Ambulatory Visit (HOSPITAL_COMMUNITY): Admission: EM | Admit: 2023-10-31 | Discharge: 2023-10-31 | Disposition: A | Discharge status: Home / Self Care

## 2023-10-31 DIAGNOSIS — Z5321 Procedure and treatment not carried out due to patient leaving prior to being seen by health care provider: Secondary | ICD-10-CM | POA: Diagnosis not present

## 2023-10-31 DIAGNOSIS — M25512 Pain in left shoulder: Secondary | ICD-10-CM | POA: Diagnosis present

## 2023-10-31 NOTE — ED Notes (Signed)
 Pt not answering to multiple calls for vitals reassessment in the lobby

## 2023-10-31 NOTE — ED Notes (Signed)
 Patient is being discharged from the Urgent Care and sent to the Emergency Department via POV. Per Dr Vonna, patient is in need of higher level of care due to left shoulder dislocation. Patient is aware and verbalizes understanding of plan of care.  Vitals:   10/31/23 1749  BP: (!) 124/107  Pulse: 91  Resp: 17  Temp: 98.3 F (36.8 C)  SpO2: 98%

## 2023-10-31 NOTE — ED Notes (Signed)
 I offered pt a sling and pt refused same saying it would make his arm hurt worse.

## 2023-10-31 NOTE — ED Provider Triage Note (Signed)
 Emergency Medicine Provider Triage Evaluation Note  Carl Haynes , a 43 y.o. male  was evaluated in triage.  Pt complains of shoulder dislocation.  He states this happens often to him.  Does not mention how this happened for this episode  Review of Systems  Positive: As above Negative: As above  Physical Exam  BP (!) 138/97 (BP Location: Right Arm)   Pulse (!) 104   Temp 98.5 F (36.9 C)   Resp 16   SpO2 97%  Gen:   Awake, no distress   Resp:  Normal effort  MSK:   Moves extremities without difficulty  Other:  Some range of motion present in the left shoulder but limited secondary to pain.  No obvious deformity.  Right shoulder without deformity and has good range of motion  Medical Decision Making  Medically screening exam initiated at 7:26 PM.  Appropriate orders placed.  Zion Ta was informed that the remainder of the evaluation will be completed by another provider, this initial triage assessment does not replace that evaluation, and the importance of remaining in the ED until their evaluation is complete.  X-ray is negative for acute fracture or dislocation.  Patient still believes that it is dislocated and would like to be evaluated in the main side of the emergency department.   Hildegard Loge, PA-C 10/31/23 1927

## 2023-10-31 NOTE — ED Triage Notes (Signed)
 Pt reports today was working when left shoulder came out of socket. Pt reports that he can normally get shoulder to pop back in place but can't this time.

## 2023-10-31 NOTE — ED Triage Notes (Signed)
 Pt sent down from UC for possible left shoulder dislocation
